# Patient Record
Sex: Male | Born: 1965 | Race: Black or African American | Hispanic: No | Marital: Single | Smoking: Never smoker
Health system: Southern US, Community
[De-identification: ages and names within clinical notes are randomized; demographics above are authoritative.]

---
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
## 2003-01-11 ENCOUNTER — Emergency Department (HOSPITAL_COMMUNITY): Admission: EM | Admit: 2003-01-11 | Discharge: 2003-01-11 | Payer: Self-pay | Admitting: Emergency Medicine

## 2003-01-11 ENCOUNTER — Encounter: Payer: Self-pay | Admitting: Emergency Medicine

## 2006-05-11 ENCOUNTER — Emergency Department (HOSPITAL_COMMUNITY): Admission: EM | Admit: 2006-05-11 | Discharge: 2006-05-11 | Payer: Self-pay | Admitting: Family Medicine

## 2013-12-14 ENCOUNTER — Encounter (HOSPITAL_COMMUNITY): Payer: Self-pay | Admitting: Behavioral Health

## 2014-03-13 ENCOUNTER — Emergency Department (INDEPENDENT_AMBULATORY_CARE_PROVIDER_SITE_OTHER)
Admission: EM | Admit: 2014-03-13 | Discharge: 2014-03-13 | Disposition: A | Discharge status: Home / Self Care | Payer: Self-pay | Source: Home / Self Care | Attending: Emergency Medicine | Admitting: Emergency Medicine

## 2014-03-13 ENCOUNTER — Encounter (HOSPITAL_COMMUNITY): Payer: Self-pay | Admitting: Emergency Medicine

## 2014-03-13 ENCOUNTER — Emergency Department (INDEPENDENT_AMBULATORY_CARE_PROVIDER_SITE_OTHER): Payer: Self-pay

## 2014-03-13 DIAGNOSIS — M25511 Pain in right shoulder: Secondary | ICD-10-CM

## 2014-03-13 DIAGNOSIS — M25519 Pain in unspecified shoulder: Secondary | ICD-10-CM

## 2014-03-13 MED ORDER — MELOXICAM 7.5 MG PO TABS: 7.5000 mg | ORAL_TABLET | Freq: Every day | ORAL | Status: DC

## 2014-03-13 NOTE — Discharge Instructions (Signed)
Your xrays were normal. Your blood pressure is elevated and your would benefit from locating a primary care doctor for follow up.   Arthralgia Your caregiver has diagnosed you as suffering from an arthralgia. Arthralgia means there is pain in a joint. This can come from many reasons including:  Bruising the joint which causes soreness (inflammation) in the joint.  Wear and tear on the joints which occur as we grow older (osteoarthritis).  Overusing the joint.  Various forms of arthritis.  Infections of the joint. Regardless of the cause of pain in your joint, most of these different pains respond to anti-inflammatory drugs and rest. The exception to this is when a joint is infected, and these cases are treated with antibiotics, if it is a bacterial infection. HOME CARE INSTRUCTIONS   Rest the injured area for as long as directed by your caregiver. Then slowly start using the joint as directed by your caregiver and as the pain allows. Crutches as directed may be useful if the ankles, knees or hips are involved. If the knee was splinted or casted, continue use and care as directed. If an stretchy or elastic wrapping bandage has been applied today, it should be removed and re-applied every 3 to 4 hours. It should not be applied tightly, but firmly enough to keep swelling down. Watch toes and feet for swelling, bluish discoloration, coldness, numbness or excessive pain. If any of these problems (symptoms) occur, remove the ace bandage and re-apply more loosely. If these symptoms persist, contact your caregiver or return to this location.  For the first 24 hours, keep the injured extremity elevated on pillows while lying down.  Apply ice for 15-20 minutes to the sore joint every couple hours while awake for the first half day. Then 03-04 times per day for the first 48 hours. Put the ice in a plastic bag and place a towel between the bag of ice and your skin.  Wear any splinting, casting, elastic  bandage applications, or slings as instructed.  Only take over-the-counter or prescription medicines for pain, discomfort, or fever as directed by your caregiver. Do not use aspirin immediately after the injury unless instructed by your physician. Aspirin can cause increased bleeding and bruising of the tissues.  If you were given crutches, continue to use them as instructed and do not resume weight bearing on the sore joint until instructed. Persistent pain and inability to use the sore joint as directed for more than 2 to 3 days are warning signs indicating that you should see a caregiver for a follow-up visit as soon as possible. Initially, a hairline fracture (break in bone) may not be evident on X-rays. Persistent pain and swelling indicate that further evaluation, non-weight bearing or use of the joint (use of crutches or slings as instructed), or further X-rays are indicated. X-rays may sometimes not show a small fracture until a week or 10 days later. Make a follow-up appointment with your own caregiver or one to whom we have referred you. A radiologist (specialist in reading X-rays) may read your X-rays. Make sure you know how you are to obtain your X-ray results. Do not assume everything is normal if you do not hear from us. SEEK MEDICAL CARE IF: Bruising, swelling, or pain increases. SEEK IMMEDIATE MEDICAL CARE IF:   Your fingers or toes are numb or blue.  The pain is not responding to medications and continues to stay the same or get worse.  The pain in your joint becomes severe.  You develop a fever over 102 F (38.9 C).  It becomes impossible to move or use the joint. MAKE SURE YOU:   Understand these instructions.  Will watch your condition.  Will get help right away if you are not doing well or get worse. Document Released: 10/29/2005 Document Revised: 01/21/2012 Document Reviewed: 06/16/2008 [facility] Patient Information 2014 [employer] [place].

## 2014-03-13 NOTE — ED Provider Notes (Signed)
CSN: [id number]     Arrival date & time 03/13/14  1702 History   First MD Initiated Contact with Patient 03/13/14 1743     Chief Complaint  Patient presents with  . Shoulder Pain   (Consider location/radiation/quality/duration/timing/severity/associated sxs/prior Treatment) HPI Comments: Patient presents requesting xrays of right shoulder. States he has had 8 days of aching discomfort in right shoulder without history of injury. 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 Reports that he shouler "feels fine" when he is moving about throughout the day. States aching pain begins in the evenings when he sits down to relax at the end of the day. Denies previus injury or surgery. Has tried topical creams, such as [facility], with minimal relief. Has not tried any medications by mouth.  Denies CP or dyspnea. PCP: none Is asymptomatic at time of exam.   The history is provided by the patient.    History reviewed. No pertinent past medical history. Past Surgical History  Procedure Laterality Date  . Appendectomy     No family history on file. History  Substance Use Topics  . Smoking status: Never Smoker   . Smokeless tobacco: Not on file  . Alcohol Use: No    Review of Systems  All other systems reviewed and are negative.   Allergies  Review of patient's allergies indicates no known allergies.  Home Medications   Prior to Admission medications   Not on File   BP 143/99  Pulse 91  Temp(Src) 98.7 F (37.1 C) (Oral)  Resp 16  SpO2 97% Physical Exam  Nursing note and vitals reviewed. Constitutional: He is oriented to person, place, and time. He appears well-developed and well-nourished. No distress.  HENT:  Head: Normocephalic and atraumatic.  Eyes: Conjunctivae are normal. No scleral icterus.  Neck: Normal range of motion. Neck supple.  Cardiovascular: Normal rate, regular rhythm and normal heart sounds.   Pulmonary/Chest: Effort normal  and breath sounds normal. No respiratory distress.  Musculoskeletal: Normal range of motion.       Right shoulder: Normal.  Neurological: He is alert and oriented to person, place, and time.  Skin: Skin is warm and dry. No rash noted. No erythema.  Psychiatric: He has a normal mood and affect. His behavior is normal.    ED Course  Procedures (including critical care time) Labs Review Labs Reviewed - No data to display  Imaging Review Dg Shoulder Right  03/13/2014   CLINICAL DATA:  Right shoulder pain for 3 weeks.  No injury.  EXAM: RIGHT SHOULDER - 2+ VIEW  COMPARISON:  None.  FINDINGS: There is no evidence of fracture or dislocation. There is no evidence of arthropathy or other focal bone abnormality. Soft tissues are unremarkable.  IMPRESSION: Negative.   Electronically Signed   By: Amie Portlandavid  Ormond M.D.   On: 03/13/2014 18:12     MDM   1. Arthralgia of shoulder region, right   Xrays unremarkable. Mobic as prescribed.  Would benefit from locating PCP to follow up with as his BP is elevated.   Jess BartersJennifer Lee HammondPresson, GeorgiaPA 03/13/14 [phone]

## 2014-03-13 NOTE — ED Notes (Signed)
Right shoulder pain for one week (4/24).  Denies any injury.  Patient repeatedly says it is the way he sleeps, he prefers lying on right side.  Reports intermittent bouts of numbness and tingling.  Otherwise throbbing sensation in shoulder.  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

## 2014-03-15 NOTE — ED Provider Notes (Signed)
Medical screening examination/treatment/procedure(s) were performed by non-physician practitioner and as supervising physician I was immediately available for consultation/collaboration.  Daevon Holdren, M.D.  Alexandr Yaworski C Ramel Tobon, MD 03/15/14 0757 

## 2014-05-09 IMAGING — CR DG SHOULDER 2+V*R*
3 series · 3 of 3 positions shown · non-contrast
Comparison: None.

CLINICAL DATA: Right shoulder pain for 3 weeks.  No injury.

EXAM:
RIGHT SHOULDER - 2+ VIEW

[view not recorded (1 of 3)]
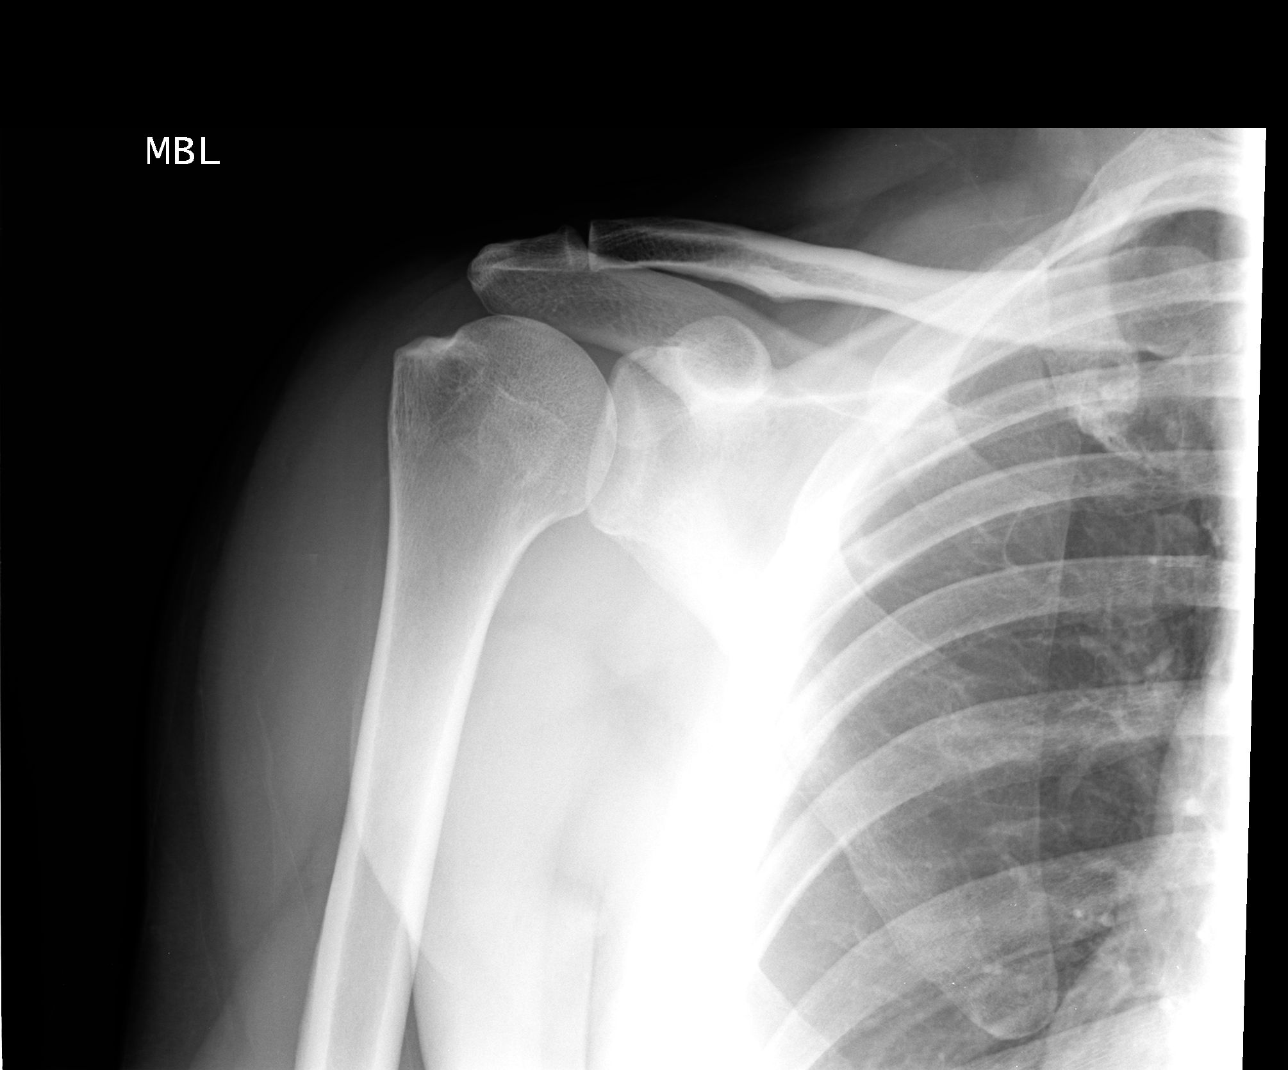

[view not recorded (2 of 3)]
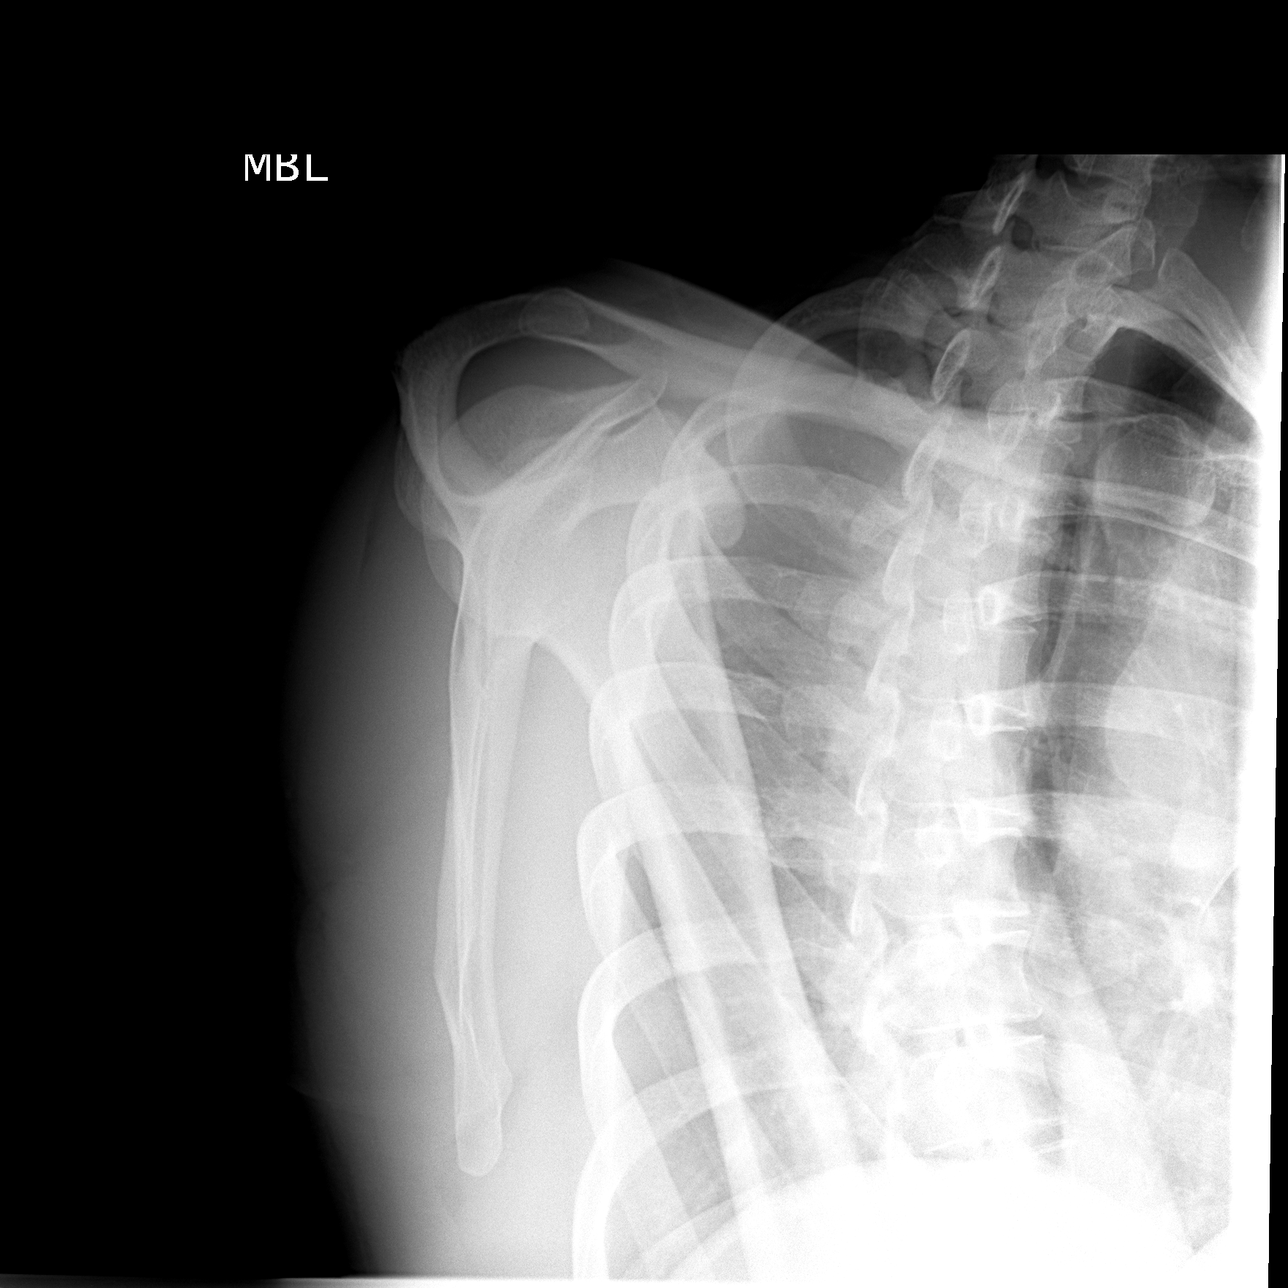

[view not recorded (3 of 3)]
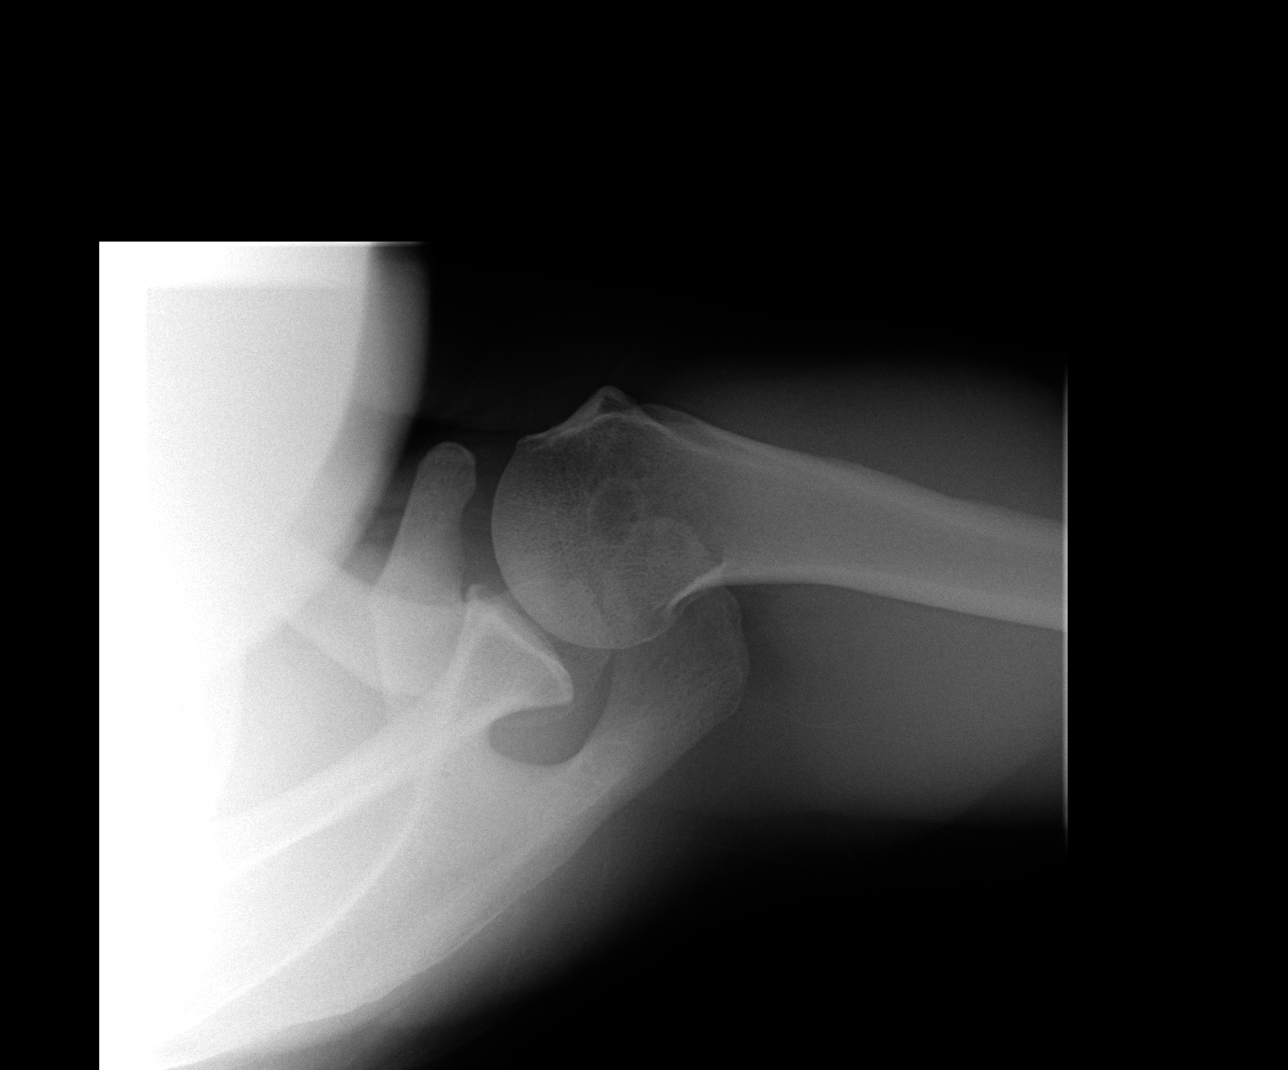

[3 of 3 positions shown; findings below may reference images not displayed]

FINDINGS: There is no evidence of fracture or dislocation. There is no
evidence of arthropathy or other focal bone abnormality. Soft
tissues are unremarkable.
IMPRESSION: Negative.

## 2014-06-21 ENCOUNTER — Ambulatory Visit: Payer: Self-pay | Attending: Internal Medicine

## 2014-07-12 ENCOUNTER — Ambulatory Visit: Payer: Self-pay | Attending: Family Medicine | Admitting: Family Medicine

## 2014-07-12 ENCOUNTER — Encounter: Payer: Self-pay | Admitting: Family Medicine

## 2014-07-12 VITALS — BP 129/87 | HR 69 | Temp 98.6°F | Resp 18 | Ht 68.0 in | Wt 188.8 lb

## 2014-07-12 DIAGNOSIS — Z113 Encounter for screening for infections with a predominantly sexual mode of transmission: Secondary | ICD-10-CM

## 2014-07-12 DIAGNOSIS — Z833 Family history of diabetes mellitus: Secondary | ICD-10-CM | POA: Insufficient documentation

## 2014-07-12 DIAGNOSIS — K089 Disorder of teeth and supporting structures, unspecified: Secondary | ICD-10-CM

## 2014-07-12 DIAGNOSIS — Z23 Encounter for immunization: Secondary | ICD-10-CM

## 2014-07-12 DIAGNOSIS — F17200 Nicotine dependence, unspecified, uncomplicated: Secondary | ICD-10-CM | POA: Insufficient documentation

## 2014-07-12 DIAGNOSIS — Z0000 Encounter for general adult medical examination without abnormal findings: Secondary | ICD-10-CM | POA: Insufficient documentation

## 2014-07-12 DIAGNOSIS — K0889 Other specified disorders of teeth and supporting structures: Secondary | ICD-10-CM

## 2014-07-12 LAB — COMPLETE METABOLIC PANEL WITH GFR
ALBUMIN: 4.8 g/dL (ref 3.5–5.2)
ALT: 38 U/L (ref 0–53)
AST: 33 U/L (ref 0–37)
Alkaline Phosphatase: 38 U/L — ABNORMAL LOW (ref 39–117)
BUN: 16 mg/dL (ref 6–23)
CALCIUM: 10.3 mg/dL (ref 8.4–10.5)
CHLORIDE: 103 meq/L (ref 96–112)
CO2: 25 mEq/L (ref 19–32)
Creat: 1.1 mg/dL (ref 0.50–1.35)
GFR, Est African American: >89 mL/min
GFR, Est Non African American: 81 mL/min
Glucose, Bld: 110 mg/dL — ABNORMAL HIGH (ref 70–99)
POTASSIUM: 4.1 meq/L (ref 3.5–5.3)
Sodium: 141 mEq/L (ref 135–145)
Total Bilirubin: 0.5 mg/dL (ref 0.2–1.2)
Total Protein: 7.8 g/dL (ref 6.0–8.3)

## 2014-07-12 LAB — LIPID PANEL
CHOL/HDL RATIO: 5.9 ratio
Cholesterol: 281 mg/dL — ABNORMAL HIGH (ref 0–200)
HDL: 48 mg/dL (ref >39)
LDL CALC: 210 mg/dL — AB (ref 0–99)
Triglycerides: 115 mg/dL (ref <150)
VLDL: 23 mg/dL (ref 0–40)

## 2014-07-12 NOTE — Assessment & Plan Note (Signed)
A: strong family history on dad's side. P:  Lipids Glucose on CMP.

## 2014-07-12 NOTE — Progress Notes (Signed)
Establish Care Annual physical  

## 2014-07-12 NOTE — Addendum Note (Signed)
Addended by: Dessa Phi on: 07/12/2014 10:46 AM   Modules accepted: Orders

## 2014-07-12 NOTE — Assessment & Plan Note (Signed)
Loose painful tooth on lower anterior tooth. No sign of abscess. Dental referral placed.

## 2014-07-12 NOTE — Assessment & Plan Note (Signed)
One time HIV  

## 2014-07-12 NOTE — Patient Instructions (Addendum)
Mr. Fennell,  Thank you for coming in today. It was a pleasure meeting you. I look forward to being your primary doctor.  #1 regarding your painful loose tooth on the bottom front, at this referral to dentistry. If you develop pain or swelling around that tooth please call.   You will be contacted with lab results. Screening lipids, CMP.   Smoking/tobacco cessation support: smoking cessation hotline: 1-800-QUIT-NOW.  Smoking cessation classes are available through [facility]. Call [phone] or visit our website at [email or web address].    Dr. Armen Pickup

## 2014-07-12 NOTE — Progress Notes (Signed)
   Subjective:    Patient ID: Nathan Gonzales, male    DOB: [birth year], 48 y.o.   MRN: [id number] CC: establish care, adult physical  HPI 49 yo M presents for a physical. He is no acute complaints today.   Social history: Chronic nonsmoker. Patient does dip tobacco and desires to quit. Family history: Strong history of diabetes.  Review of Systems  General:  Negative for nexplained weight loss, fever Skin: Negative for new or changing mole, sore that won't heal HEENT: Positive for tooth pain. Negative for trouble hearing, trouble seeing, ringing in ears, mouth sores, hoarseness, change in voice, dysphagia. CV:  Negative for chest pain, dyspnea, edema, palpitations Resp: Negative for cough, dyspnea, hemoptysis GI: Negative for nausea, vomiting, diarrhea, constipation, abdominal pain, melena, hematochezia. GU: Negative for dysuria, incontinence, urinary hesitance, hematuria, vaginal or penile discharge, polyuria, sexual difficulty, lumps in testicle or breasts MSK: Negative for muscle cramps or aches, joint pain or swelling Neuro: Negative for headaches, weakness, numbness, dizziness, passing out/fainting Psych: Negative for depression, anxiety, memory problems    Objective:   Physical Exam BP 129/87  Pulse 69  Temp(Src) 98.6 F (37 C) (Oral)  Resp 18  Ht  (1.727 m)  Wt 188 lb 12.8 oz (85.639 kg)  BMI 28.71 kg/m2  SpO2 98%  General Appearance:    Alert, cooperative, no distress, appears stated age  Head:    Normocephalic, without obvious abnormality, atraumatic  Eyes:    PERRL, conjunctiva/corneas clear, EOM's intact, fundi    benign, both eyes       Ears:    Normal TM's and external ear canals, both ears  Nose:   Nares normal, septum midline, mucosa normal, no drainage   or sinus tenderness  Throat:   Poor dentition, loose lower anterior tooth, Lips, mucosa, and tongue normal; teeth and gums normal  Neck:   Supple, symmetrical, trachea midline, no adenopathy;      thyroid:  No enlargement/tenderness/nodules; no carotid   bruit or JVD  Back:     Symmetric, no curvature, ROM normal, no CVA tenderness  Lungs:     Clear to auscultation bilaterally, respirations unlabored  Chest wall:    No tenderness or deformity  Heart:    Regular rate and rhythm, S1 and S2 normal, no murmur, rub   or gallop  Abdomen:     Soft, non-tender, bowel sounds active all four quadrants,    no masses, no organomegaly  Genitalia:    Deferred   Rectal:    Normal tone, normal prostate, no masses or tenderness;   guaiac negative stool  Extremities:   Extremities normal, atraumatic, no cyanosis or edema  Pulses:   2+ and symmetric all extremities  Skin:   Skin color, texture, turgor normal, no rashes or lesions  Lymph nodes:   Cervical, supraclavicular, and axillary nodes normal  Neurologic:   CNII-XII intact. Normal strength, sensation and reflexes      throughout         Assessment & Plan:

## 2014-07-13 LAB — HIV ANTIBODY (ROUTINE TESTING W REFLEX): HIV 1&2 Ab, 4th Generation: NONREACTIVE

## 2014-07-14 ENCOUNTER — Telehealth: Payer: Self-pay | Admitting: *Deleted

## 2014-07-14 NOTE — Telephone Encounter (Signed)
Left voice massage with male to return call 

## 2014-07-14 NOTE — Telephone Encounter (Signed)
Patient left a voicemail on nurse line yesterday about dental referral. Returned patient call today. Informed patient that Dr. Armen Pickup did put in a dental referral on 07/12/2014. Patient informed that since he has [facility] he could not be referred to [facility]. Patient informed that Arna Medici (referral coordinator) has mailed him out some resources and he should receive this by Saturday. Patient verbalized understanding. Patient states his [facility] expires 09/2014 and wanted to know if he has to come back in for re certification. Informed patient he will have to re certify. Annamaria Helling, RN

## 2014-07-14 NOTE — Telephone Encounter (Signed)
Message copied by Dyann Kief on Wed Jul 14, 2014  5:41 PM ------      Message from: Dessa Phi      Created: Tue Jul 13, 2014  2:29 PM       Please inform patient.      HIV negative, normal CMP including blood sugar.      Heart disease risk low based on cholesterol panel and medical history.       No medications needed.      Regular exercise and diet high in veggies (with every lunch and dinner) and lean protein recommended        ------

## 2014-10-01 ENCOUNTER — Ambulatory Visit: Payer: Self-pay | Attending: Family Medicine

## 2020-02-15 ENCOUNTER — Ambulatory Visit: Payer: Self-pay | Attending: Internal Medicine

## 2020-02-15 DIAGNOSIS — Z23 Encounter for immunization: Secondary | ICD-10-CM

## 2020-02-15 NOTE — Progress Notes (Signed)
   Covid-19 Vaccination Clinic  Name:  Nathan Gonzales    MRN: [id number] DOB: 01-12-1966  02/15/2020  [name] was observed post Covid-19 immunization for 15 minutes without incident. He was provided with Vaccine Information Sheet and instruction to access the V-Safe system.   [name] was instructed to call 911 with any severe reactions post vaccine: Marland Kitchen Difficulty breathing  . Swelling of face and throat  . A fast heartbeat  . A bad rash all over body  . Dizziness and weakness   Immunizations Administered    Name Date Dose VIS Date Route   Pfizer COVID-19 Vaccine 02/15/2020 12:28 PM 0.3 mL 10/23/2019 Intramuscular   Manufacturer: [employer], [employer]   Lot: [id number]   NDC: [address]-7

## 2020-03-09 ENCOUNTER — Ambulatory Visit: Payer: Self-pay | Attending: Internal Medicine

## 2020-03-09 DIAGNOSIS — Z23 Encounter for immunization: Secondary | ICD-10-CM

## 2020-03-09 NOTE — Progress Notes (Signed)
   Covid-19 Vaccination Clinic  Name:  Nathan Gonzales    MRN: [id number] DOB: 03/31/1966  03/09/2020  [name] was observed post Covid-19 immunization for 15 minutes without incident. He was provided with Vaccine Information Sheet and instruction to access the V-Safe system.   [name] was instructed to call 911 with any severe reactions post vaccine: Marland Kitchen Difficulty breathing  . Swelling of face and throat  . A fast heartbeat  . A bad rash all over body  . Dizziness and weakness   Immunizations Administered    Name Date Dose VIS Date Route   Pfizer COVID-19 Vaccine 03/09/2020  3:34 PM 0.3 mL 01/06/2019 Intramuscular   Manufacturer: [employer], [employer]   Lot: [id number]   NDC: [address]-0

## 2020-09-24 ENCOUNTER — Ambulatory Visit: Payer: Self-pay | Attending: Internal Medicine

## 2020-09-24 DIAGNOSIS — Z23 Encounter for immunization: Secondary | ICD-10-CM

## 2020-09-24 NOTE — Progress Notes (Signed)
   Covid-19 Vaccination Clinic  Name:  Nathan Gonzales    MRN: [id number] DOB: 28-May-1966  09/24/2020  Nathan Gonzales was observed post Covid-19 immunization for 15 minutes without incident. He was provided with Vaccine Information Sheet and instruction to access the V-Safe system.   Nathan Gonzales was instructed to call 911 with any severe reactions post vaccine: Marland Kitchen Difficulty breathing  . Swelling of face and throat  . A fast heartbeat  . A bad rash all over body  . Dizziness and weakness
